# Patient Record
Sex: Male | Born: 1980 | Hispanic: No | Marital: Married | Smoking: Never smoker
Health system: Southern US, Community
[De-identification: ages and names within clinical notes are randomized; demographics above are authoritative.]

---

## 2016-07-27 ENCOUNTER — Ambulatory Visit (HOSPITAL_COMMUNITY)
Admission: EM | Admit: 2016-07-27 | Discharge: 2016-07-27 | Disposition: A | Discharge status: Home / Self Care | Payer: BLUE CROSS/BLUE SHIELD | Attending: Family Medicine | Admitting: Family Medicine

## 2016-07-27 ENCOUNTER — Encounter (HOSPITAL_COMMUNITY): Payer: Self-pay | Admitting: *Deleted

## 2016-07-27 ENCOUNTER — Ambulatory Visit (INDEPENDENT_AMBULATORY_CARE_PROVIDER_SITE_OTHER): Payer: BLUE CROSS/BLUE SHIELD

## 2016-07-27 DIAGNOSIS — M5432 Sciatica, left side: Secondary | ICD-10-CM | POA: Diagnosis not present

## 2016-07-27 MED ORDER — KETOROLAC TROMETHAMINE 30 MG/ML IJ SOLN
INTRAMUSCULAR | Status: AC
  Filled 2016-07-27: qty 1

## 2016-07-27 MED ORDER — METHYLPREDNISOLONE ACETATE 80 MG/ML IJ SUSP
80.0000 mg | Freq: Once | INTRAMUSCULAR | Status: AC
  Administered 2016-07-27: 80 mg via INTRAMUSCULAR

## 2016-07-27 MED ORDER — METHYLPREDNISOLONE ACETATE 80 MG/ML IJ SUSP
INTRAMUSCULAR | Status: AC
  Filled 2016-07-27: qty 1

## 2016-07-27 MED ORDER — METHYLPREDNISOLONE 4 MG PO TBPK: ORAL_TABLET | ORAL | 0 refills | Status: DC

## 2016-07-27 MED ORDER — KETOROLAC TROMETHAMINE 30 MG/ML IJ SOLN
30.0000 mg | Freq: Once | INTRAMUSCULAR | Status: AC
  Administered 2016-07-27: 30 mg via INTRAMUSCULAR

## 2016-07-27 NOTE — ED Triage Notes (Signed)
Pt  Reports      Back   Pain      With         Pain  Running   l  Leg     Pt  Reports      Symptoms     Began  About  3  Months  Ago he  Injure  The  Leg   Playing soccer

## 2016-07-27 NOTE — ED Provider Notes (Signed)
[facility]    CSN: [id number] Arrival date & time: 07/27/16  1314     History   Chief Complaint Chief Complaint  Patient presents with  . Back Pain    HPI Larry Mcdaniel is a 35 y.o. male.   The history is provided by the patient.  Back Pain  Location:  Lumbar spine Quality:  Shooting Radiates to:  L posterior upper leg Pain severity:  Mild Onset quality:  Gradual Duration:  3 months Progression:  Worsening Chronicity:  Chronic Context: not recent injury   Ineffective treatments:  None tried Associated symptoms: leg pain   Associated symptoms: no abdominal pain, no abdominal swelling, no bladder incontinence, no bowel incontinence, no fever, no headaches, no numbness, no paresthesias and no weakness     History reviewed. No pertinent past medical history.  There are no active problems to display for this patient.   History reviewed. No pertinent surgical history.     Home Medications    Prior to Admission medications   Medication Sig Start Date End Date Taking? Authorizing Provider  methylPREDNISolone (MEDROL DOSEPAK) 4 MG TBPK tablet follow package directions, start on sun, take until finished 07/27/16   Linna Hoff, MD    Family History History reviewed. No pertinent family history.  Social History Social History  Substance Use Topics  . Smoking status: Never Smoker  . Smokeless tobacco: Never Used  . Alcohol use Not on file     Allergies   Review of patient's allergies indicates no known allergies.   Review of Systems Review of Systems  Constitutional: Negative.  Negative for fever.  Gastrointestinal: Negative.  Negative for abdominal pain and bowel incontinence.  Genitourinary: Negative.  Negative for bladder incontinence.  Musculoskeletal: Positive for back pain. Negative for gait problem.  Skin: Negative.   Neurological: Negative for weakness, numbness, headaches and paresthesias.  All other systems reviewed and are  negative.    Physical Exam Triage Vital Signs ED Triage Vitals [07/27/16 1344]  Enc Vitals Group     BP 113/62     Pulse Rate 60     Resp 12     Temp 98.3 F (36.8 C)     Temp Source Oral     SpO2 99 %     Weight      Height      Head Circumference      Peak Flow      Pain Score      Pain Loc      Pain Edu?      Excl. in GC?    No data found.   Updated Vital Signs BP 113/62 (BP Location: Left Arm)   Pulse 60   Temp 98.3 F (36.8 C) (Oral)   Resp 12   SpO2 99%   Visual Acuity Right Eye Distance:   Left Eye Distance:   Bilateral Distance:    Right Eye Near:   Left Eye Near:    Bilateral Near:     Physical Exam  Constitutional: He is oriented to person, place, and time. He appears well-developed and well-nourished. No distress.  Abdominal: Soft. Bowel sounds are normal. There is no tenderness.  Musculoskeletal: He exhibits tenderness. He exhibits no deformity.       Lumbar back: He exhibits decreased range of motion, tenderness, bony tenderness, pain and spasm. He exhibits normal pulse.       Back:  Neurological: He is alert and oriented to person, place, and time.  Skin:  Skin is warm and dry.  Nursing note and vitals reviewed.    UC Treatments / Results  Labs (all labs ordered are listed, but only abnormal results are displayed) Labs Reviewed - No data to display  EKG  EKG Interpretation None       Radiology Dg Lumbar Spine Complete  Result Date: 07/27/2016 CLINICAL DATA:  Back pain for 3 months, worse recently, radiating pain to left leg. EXAM: LUMBAR SPINE - COMPLETE 4+ VIEW COMPARISON:  None. FINDINGS: Osseous alignment is normal. Bone mineralization is normal. No acute or suspicious osseous finding. No fracture line or displaced fracture fragment. No evidence of pars interarticularis defect or osseous neural foramen narrowing. Upper sacrum is unremarkable. Paravertebral soft tissues appear normal. IMPRESSION: Negative. Electronically Signed    By: Bary RichardStan  Maynard M.D.   On: 07/27/2016 14:41    Procedures Procedures (including critical care time)  Medications Ordered in UC Medications  ketorolac (TORADOL) 30 MG/ML injection 30 mg (not administered)  methylPREDNISolone acetate (DEPO-MEDROL) injection 80 mg (not administered)     Initial Impression / Assessment and Plan / UC Course  I have reviewed the triage vital signs and the nursing notes.  Pertinent labs & imaging results that were available during my care of the patient were reviewed by me and considered in my medical decision making (see chart for details).  Clinical Course      Final Clinical Impressions(s) / UC Diagnoses   Final diagnoses:  Sciatica of left side    New Prescriptions New Prescriptions   METHYLPREDNISOLONE (MEDROL DOSEPAK) 4 MG TBPK TABLET    follow package directions, start on sun, take until finished     Linna HoffJames D Kindl, MD 07/27/16 1558

## 2017-09-20 IMAGING — DX DG LUMBAR SPINE COMPLETE 4+V
5 series · 5 of 5 positions shown · non-contrast
Comparison: None.

CLINICAL DATA: Back pain for 3 months, worse recently, radiating
pain to left leg.

EXAM:
LUMBAR SPINE - COMPLETE 4+ VIEW

[l-spine ap]
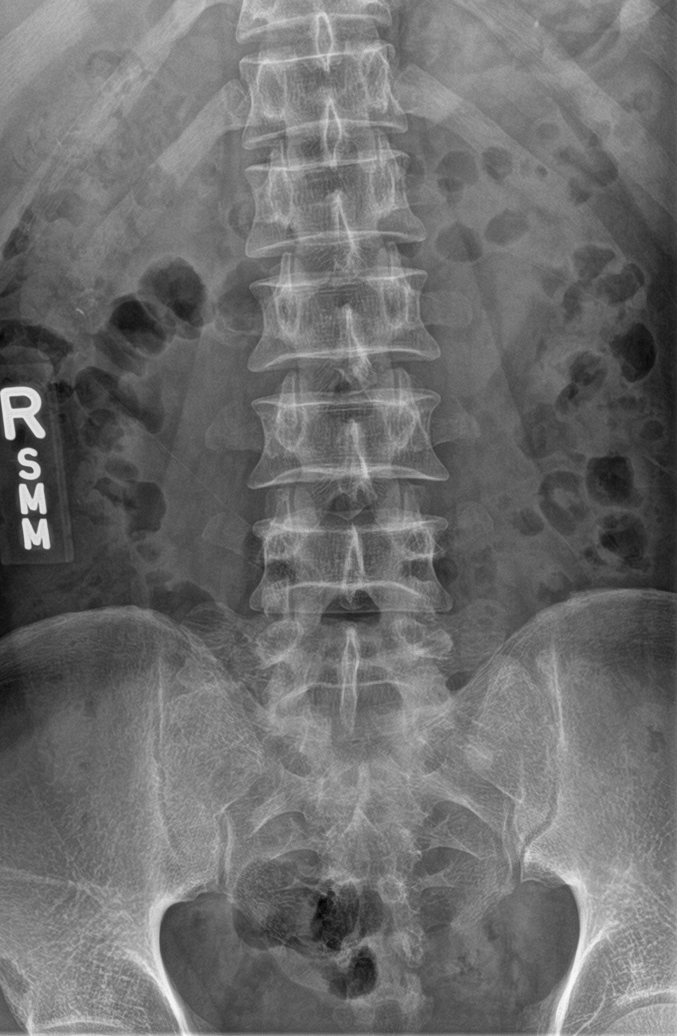

[l-spine obl (1 of 2)]
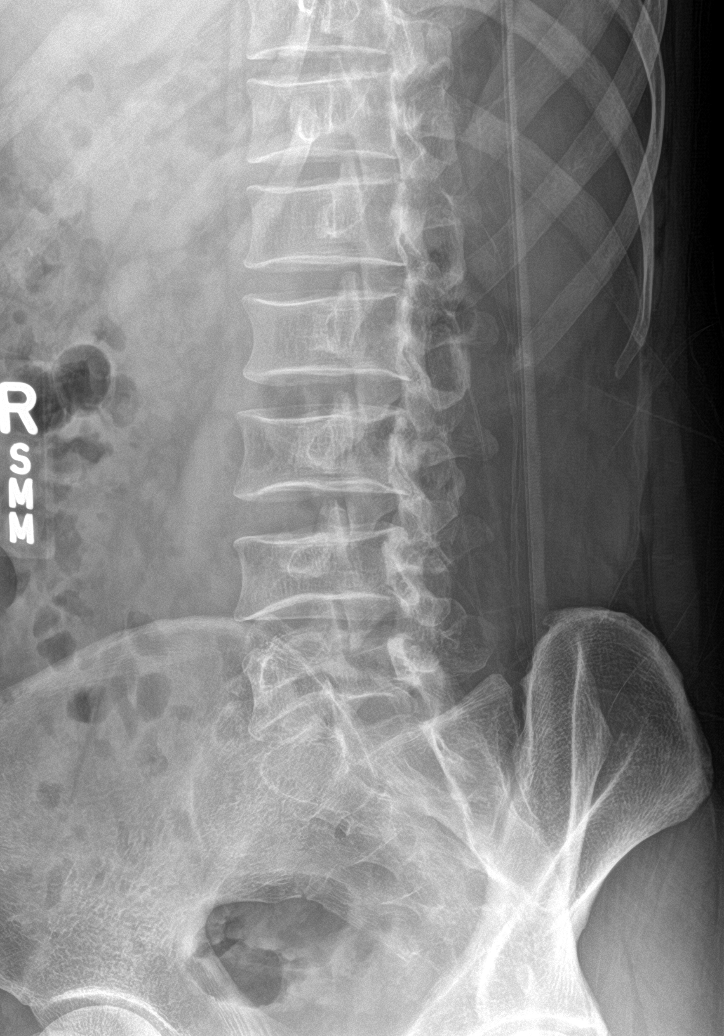

[l-spine obl (2 of 2)]
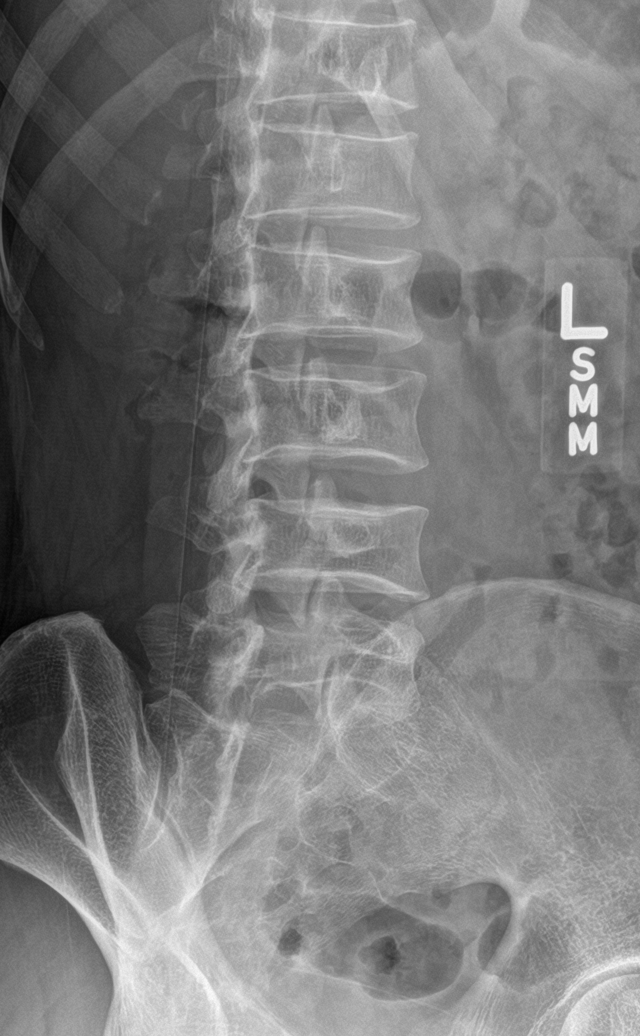

[l-spine lat]
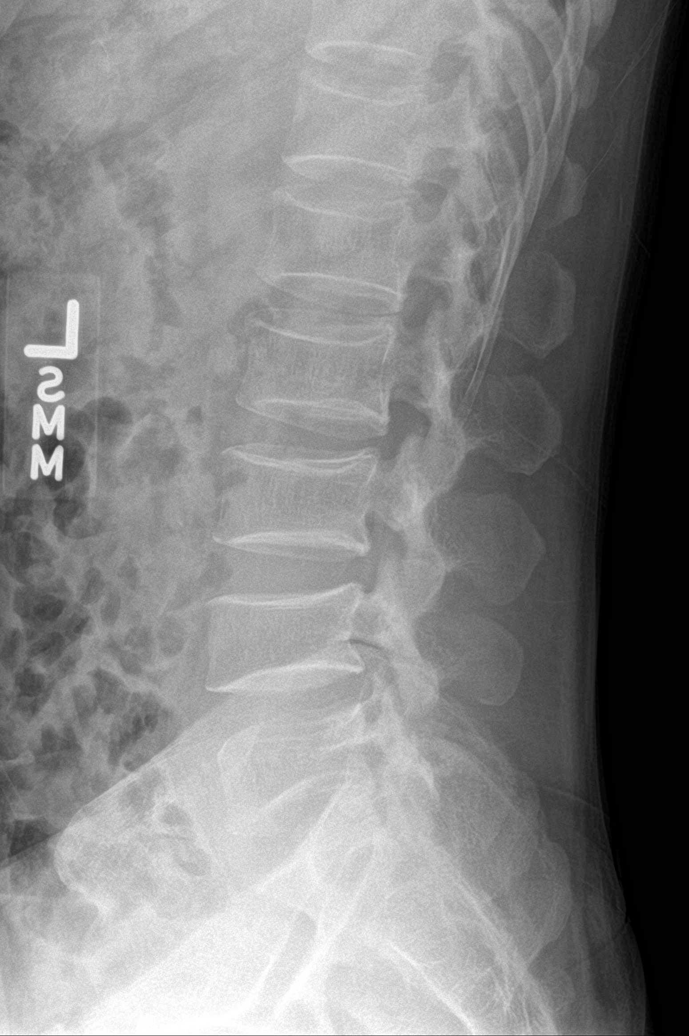

[l-spine spot]
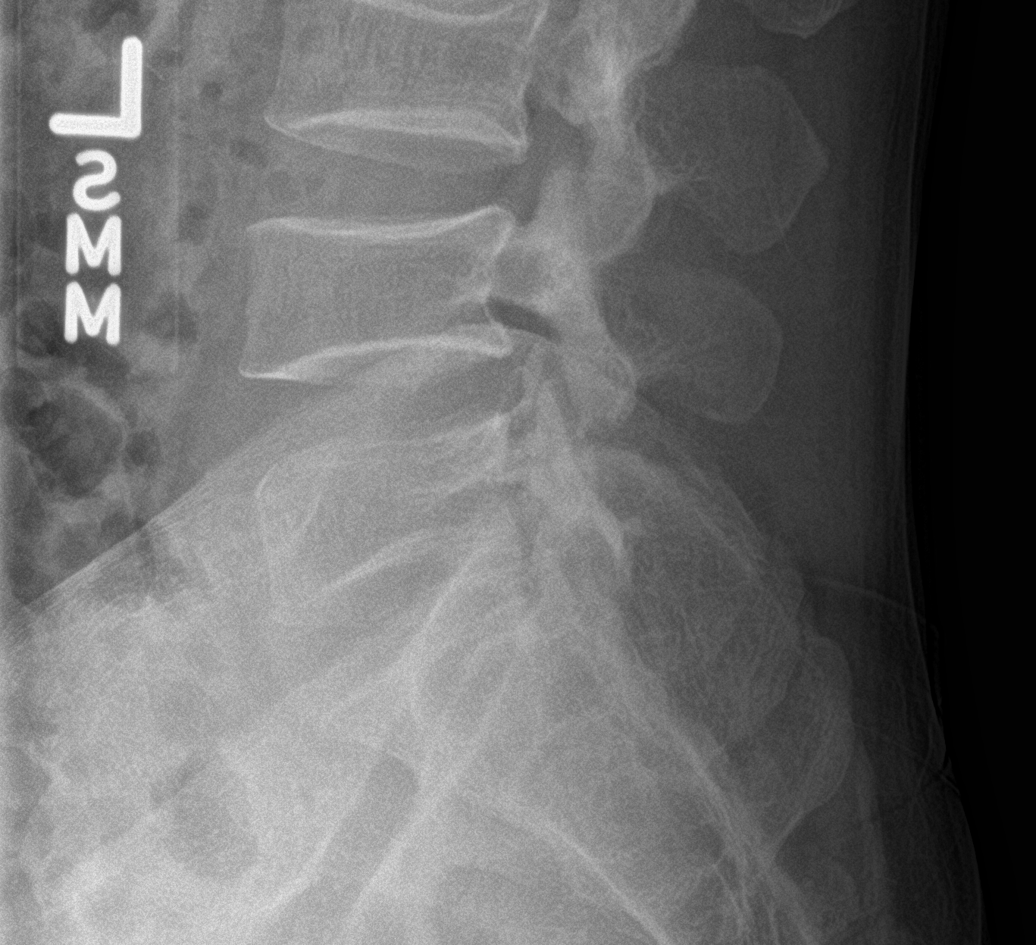

[5 of 5 positions shown; findings below may reference images not displayed]

FINDINGS: Osseous alignment is normal. Bone mineralization is normal. No acute
or suspicious osseous finding. No fracture line or displaced
fracture fragment. No evidence of pars interarticularis defect or
osseous neural foramen narrowing. Upper sacrum is unremarkable.
Paravertebral soft tissues appear normal.
IMPRESSION: Negative.

## 2018-03-04 ENCOUNTER — Encounter (HOSPITAL_COMMUNITY): Payer: Self-pay | Admitting: Emergency Medicine

## 2018-03-04 ENCOUNTER — Ambulatory Visit (HOSPITAL_COMMUNITY)
Admission: EM | Admit: 2018-03-04 | Discharge: 2018-03-04 | Disposition: A | Discharge status: Home / Self Care | Payer: BLUE CROSS/BLUE SHIELD | Attending: Family Medicine | Admitting: Family Medicine

## 2018-03-04 DIAGNOSIS — L509 Urticaria, unspecified: Secondary | ICD-10-CM

## 2018-03-04 MED ORDER — PREDNISONE 10 MG (21) PO TBPK: ORAL_TABLET | Freq: Every day | ORAL | 0 refills | Status: AC

## 2018-03-04 NOTE — ED Triage Notes (Signed)
Pt sts rash to stomach and arms after helping friend cut down tree

## 2018-03-04 NOTE — ED Provider Notes (Signed)
  [facility]   [id number] 03/04/18 Arrival Time: 1005  ASSESSMENT & PLAN:  1. Hives     Meds ordered this encounter  Medications  . predniSONE (STERAPRED UNI-PAK 21 TAB) 10 MG (21) TBPK tablet    Sig: Take by mouth daily. Take as directed.    Dispense:  21 tablet    Refill:  0   Benadryl if needed. Will follow up with PCP or here if worsening or failing to improve as anticipated. Reviewed expectations re: course of current medical issues. Questions answered. Outlined signs and symptoms indicating need for more acute intervention. Patient verbalized understanding. After Visit Summary given.   SUBJECTIVE:  Larry Mcdaniel is a 37 y.o. male who presents with a skin complaint.   Location: torso Onset: abrupt Duration: a few days Pruritic? Yes Painful? No Progression: increasing steadily  Drainage? No  Known trigger? Started after cutting down tree? Questions contact with tree as cause. New soaps/lotions/topicals/detergents? No Recent travel? No  Other associated symptoms: none Therapies tried thus far: none Denies fever. No specific aggravating or alleviating factors reported.  ROS: As per HPI.  OBJECTIVE: Vitals:   03/04/18 1035  BP: (!) 117/94  Pulse: (!) 50  Resp: 18  Temp: 98.1 F (36.7 C)  TempSrc: Oral  SpO2: 100%    General appearance: alert; no distress Lungs: clear to auscultation bilaterally Heart: regular rate and rhythm Extremities: no edema Skin: warm and dry; smooth, slightly elevated and erythematous plaques of variable size over his torso Psychological: alert and cooperative; normal mood and affect  No Known Allergies   Social History   Socioeconomic History  . Marital status: Married    Spouse name: Not on file  . Number of children: Not on file  . Years of education: Not on file  . Highest education level: Not on file  Occupational History  . Not on file  Social Needs  . Financial resource strain: Not on file  . Food  insecurity:    Worry: Not on file    Inability: Not on file  . Transportation needs:    Medical: Not on file    Non-medical: Not on file  Tobacco Use  . Smoking status: Never Smoker  . Smokeless tobacco: Never Used  Substance and Sexual Activity  . Alcohol use: Never    Frequency: Never  . Drug use: Never  . Sexual activity: Not on file  Lifestyle  . Physical activity:    Days per week: Not on file    Minutes per session: Not on file  . Stress: Not on file  Relationships  . Social connections:    Talks on phone: Not on file    Gets together: Not on file    Attends religious service: Not on file    Active member of club or organization: Not on file    Attends meetings of clubs or organizations: Not on file    Relationship status: Not on file  . Intimate partner violence:    Fear of current or ex partner: Not on file    Emotionally abused: Not on file    Physically abused: Not on file    Forced sexual activity: Not on file  Other Topics Concern  . Not on file  Social History Narrative  . Not on file   History reviewed. No pertinent surgical history.   Mardella Layman, MD 03/04/18 1119

## 2019-12-03 ENCOUNTER — Ambulatory Visit: Payer: BLUE CROSS/BLUE SHIELD | Attending: Internal Medicine

## 2019-12-03 DIAGNOSIS — Z20822 Contact with and (suspected) exposure to covid-19: Secondary | ICD-10-CM

## 2019-12-04 LAB — NOVEL CORONAVIRUS, NAA: SARS-CoV-2, NAA: DETECTED — AB

## 2019-12-15 ENCOUNTER — Ambulatory Visit: Payer: BLUE CROSS/BLUE SHIELD | Attending: Internal Medicine

## 2019-12-15 DIAGNOSIS — Z20822 Contact with and (suspected) exposure to covid-19: Secondary | ICD-10-CM

## 2019-12-16 LAB — NOVEL CORONAVIRUS, NAA: SARS-CoV-2, NAA: NOT DETECTED

## 2019-12-17 ENCOUNTER — Other Ambulatory Visit: Payer: BLUE CROSS/BLUE SHIELD

## 2019-12-17 ENCOUNTER — Telehealth: Payer: Self-pay

## 2019-12-17 NOTE — Telephone Encounter (Signed)
Pt advised using [place] interpreter # [phone] how to get his his recent covid results. He is advised to notify [employer].  He voiced understanding.

## 2019-12-17 NOTE — Telephone Encounter (Signed)
Pt is requesting to have his most recent covid test results faxed to [phone] attention: [place].   Will send request to nurse triage.   Joycelyn Rua Hopkins

## 2022-04-02 ENCOUNTER — Ambulatory Visit (HOSPITAL_COMMUNITY)
Admission: EM | Admit: 2022-04-02 | Discharge: 2022-04-02 | Disposition: A | Discharge status: Home / Self Care | Payer: BLUE CROSS/BLUE SHIELD | Attending: Physician Assistant | Admitting: Physician Assistant

## 2022-04-02 DIAGNOSIS — S39012A Strain of muscle, fascia and tendon of lower back, initial encounter: Secondary | ICD-10-CM | POA: Diagnosis not present

## 2022-04-02 MED ORDER — KETOROLAC TROMETHAMINE 30 MG/ML IJ SOLN
INTRAMUSCULAR | Status: AC
  Filled 2022-04-02: qty 1

## 2022-04-02 MED ORDER — CYCLOBENZAPRINE HCL 5 MG PO TABS: 5.0000 mg | ORAL_TABLET | Freq: Three times a day (TID) | ORAL | 0 refills | Status: AC | PRN

## 2022-04-02 MED ORDER — KETOROLAC TROMETHAMINE 30 MG/ML IJ SOLN
30.0000 mg | Freq: Once | INTRAMUSCULAR | Status: AC
  Administered 2022-04-02: 30 mg via INTRAMUSCULAR

## 2022-04-02 NOTE — Discharge Instructions (Addendum)
Can take flexeril as needed for muscle spasm Can take ibuprofen or tylenol as needed for pain Recommend ice to affected area and gentle stretching Return if symptoms become worse.

## 2022-04-02 NOTE — ED Triage Notes (Signed)
Pt presents with lower sided back pain x 1 week. Pt report pain in his legs. He is taking Aleve for pain.

## 2022-04-02 NOTE — ED Provider Notes (Signed)
[facility]    CSN: [id number] Arrival date & time: 04/02/22  1645      History   Chief Complaint Chief Complaint  Patient presents with   Back Pain   Fever    HPI Larry Mcdaniel is a 41 y.o. male.   Pt complains of lower back pain that started about one week ago with radiation into right buttocks and right posterior thigh.  He has experienced similar sx in the past.  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  He has taken Aleve with minimal relief.  Pain is work with movement, standing from sitting. Denies numbness, tingling, weakness, saddle anesthesia.    No past medical history on file.  There are no problems to display for this patient.   No past surgical history on file.     Home Medications    Prior to Admission medications   Medication Sig Start Date End Date Taking? Authorizing Provider  cyclobenzaprine (FLEXERIL) 5 MG tablet Take 1 tablet (5 mg total) by mouth 3 (three) times daily as needed for muscle spasms. 04/02/22  Yes Ward, Tylene Fantasia, PA-C  predniSONE (STERAPRED UNI-PAK 21 TAB) 10 MG (21) TBPK tablet Take by mouth daily. Take as directed. 03/04/18   Mardella Layman, MD    Family History No family history on file.  Social History Social History   Tobacco Use   Smoking status: Never   Smokeless tobacco: Never  Substance Use Topics   Alcohol use: Never   Drug use: Never     Allergies   Patient has no known allergies.   Review of Systems Review of Systems  Constitutional:  Negative for chills and fever.  HENT:  Negative for ear pain and sore throat.   Eyes:  Negative for pain and visual disturbance.  Respiratory:  Negative for cough and shortness of breath.   Cardiovascular:  Negative for chest pain and palpitations.  Gastrointestinal:  Negative for abdominal pain and vomiting.  Genitourinary:  Negative for dysuria and hematuria.  Musculoskeletal:  Positive for back pain. Negative for arthralgias.  Skin:   Negative for color change and rash.  Neurological:  Negative for seizures and syncope.  All other systems reviewed and are negative.   Physical Exam Triage Vital Signs ED Triage Vitals  Enc Vitals Group     BP 04/02/22 1801 122/81     Pulse Rate 04/02/22 1801 94     Resp 04/02/22 1801 16     Temp 04/02/22 1801 99 F (37.2 C)     Temp Source 04/02/22 1801 Oral     SpO2 04/02/22 1801 100 %     Weight --      Height --      Head Circumference --      Peak Flow --      Pain Score 04/02/22 1847 6     Pain Loc --      Pain Edu? --      Excl. in GC? --    No data found.  Updated Vital Signs BP 122/81 (BP Location: Left Arm)   Pulse 94   Temp 99 F (37.2 C) (Oral)   Resp 16   SpO2 100%   Visual Acuity Right Eye Distance:   Left Eye Distance:   Bilateral Distance:    Right Eye Near:   Left Eye Near:    Bilateral Near:     Physical Exam Vitals and nursing note reviewed.  Constitutional:  General: He is not in acute distress.    Appearance: He is well-developed.  HENT:     Head: Normocephalic and atraumatic.  Eyes:     Conjunctiva/sclera: Conjunctivae normal.  Cardiovascular:     Rate and Rhythm: Normal rate and regular rhythm.     Heart sounds: No murmur heard. Pulmonary:     Effort: Pulmonary effort is normal. No respiratory distress.     Breath sounds: Normal breath sounds.  Abdominal:     Palpations: Abdomen is soft.     Tenderness: There is no abdominal tenderness.  Musculoskeletal:        General: No swelling.     Cervical back: Neck supple.     Comments: Lumbar paraspinal musculature tenderness with spasm.  Negative SLR.   Skin:    General: Skin is warm and dry.     Capillary Refill: Capillary refill takes less than 2 seconds.  Neurological:     Mental Status: He is alert.  Psychiatric:        Mood and Affect: Mood normal.     UC Treatments / Results  Labs (all labs ordered are listed, but only abnormal results are displayed) Labs  Reviewed - No data to display  EKG   Radiology No results found.  Procedures Procedures (including critical care time)  Medications Ordered in UC Medications  ketorolac (TORADOL) 30 MG/ML injection 30 mg (30 mg Intramuscular Given 04/02/22 1841)    Initial Impression / Assessment and Plan / UC Course  I have reviewed the triage vital signs and the nursing notes.  Pertinent labs & imaging results that were available during my care of the patient were reviewed by me and considered in my medical decision making (see chart for details).     Lumbar strain, spasm, negative SLR, no saddle anesthesia. Flexeril prescribed as needed.  Advised gentle stretching, ice to affected area.  Return precautions discussed.  Final Clinical Impressions(s) / UC Diagnoses   Final diagnoses:  Strain of lumbar region, initial encounter     Discharge Instructions      Can take flexeril as needed for muscle spasm Can take ibuprofen or tylenol as needed for pain Recommend ice to affected area and gentle stretching Return if symptoms become worse.    ED Prescriptions     Medication Sig Dispense Auth. Provider   cyclobenzaprine (FLEXERIL) 5 MG tablet Take 1 tablet (5 mg total) by mouth 3 (three) times daily as needed for muscle spasms. 30 tablet Ward, Lenise Arena, PA-C      PDMP not reviewed this encounter.   Ward, Lenise Arena, PA-C 04/02/22 1916
